# Patient Record
Sex: Female | Born: 1955 | Race: White | Hispanic: No | Marital: Married | Smoking: Former smoker
Health system: Southern US, Community
[De-identification: ages and names within clinical notes are randomized; demographics above are authoritative.]

## PROBLEM LIST (undated history)

## (undated) DIAGNOSIS — I10 Essential (primary) hypertension: Secondary | ICD-10-CM

## (undated) DIAGNOSIS — B029 Zoster without complications: Secondary | ICD-10-CM

## (undated) HISTORY — PX: APPENDECTOMY: SHX54

## (undated) HISTORY — DX: Essential (primary) hypertension: I10

## (undated) HISTORY — PX: ENDOMETRIAL ABLATION: SHX621

## (undated) HISTORY — PX: TUBAL LIGATION: SHX77

## (undated) HISTORY — PX: LEG SURGERY: SHX1003

## (undated) HISTORY — DX: Zoster without complications: B02.9

---

## 2015-07-29 ENCOUNTER — Encounter: Payer: Self-pay | Admitting: Gastroenterology

## 2015-08-17 ENCOUNTER — Ambulatory Visit: Payer: Self-pay | Admitting: Gastroenterology

## 2015-08-25 ENCOUNTER — Ambulatory Visit: Payer: Self-pay | Admitting: Gastroenterology

## 2017-01-22 ENCOUNTER — Other Ambulatory Visit (HOSPITAL_COMMUNITY): Payer: Self-pay | Admitting: Adult Health Nurse Practitioner

## 2017-01-22 DIAGNOSIS — Z87891 Personal history of nicotine dependence: Secondary | ICD-10-CM

## 2017-02-01 ENCOUNTER — Ambulatory Visit (HOSPITAL_COMMUNITY)
Admission: RE | Admit: 2017-02-01 | Discharge: 2017-02-01 | Disposition: A | Discharge status: Home / Self Care | Payer: BLUE CROSS/BLUE SHIELD | Source: Ambulatory Visit | Attending: Adult Health Nurse Practitioner | Admitting: Adult Health Nurse Practitioner

## 2017-02-01 DIAGNOSIS — J432 Centrilobular emphysema: Secondary | ICD-10-CM | POA: Insufficient documentation

## 2017-02-01 DIAGNOSIS — Z87891 Personal history of nicotine dependence: Secondary | ICD-10-CM | POA: Diagnosis not present

## 2017-02-01 DIAGNOSIS — Z122 Encounter for screening for malignant neoplasm of respiratory organs: Secondary | ICD-10-CM | POA: Diagnosis not present

## 2017-02-01 DIAGNOSIS — I2510 Atherosclerotic heart disease of native coronary artery without angina pectoris: Secondary | ICD-10-CM | POA: Insufficient documentation

## 2020-04-06 ENCOUNTER — Other Ambulatory Visit (HOSPITAL_COMMUNITY): Payer: Self-pay | Admitting: Physician Assistant

## 2020-04-06 DIAGNOSIS — Z1231 Encounter for screening mammogram for malignant neoplasm of breast: Secondary | ICD-10-CM

## 2020-04-15 ENCOUNTER — Ambulatory Visit (HOSPITAL_COMMUNITY)
Admission: RE | Admit: 2020-04-15 | Discharge: 2020-04-15 | Disposition: A | Discharge status: Home / Self Care | Payer: 59 | Source: Ambulatory Visit | Attending: Physician Assistant | Admitting: Physician Assistant

## 2020-04-15 ENCOUNTER — Other Ambulatory Visit: Payer: Self-pay

## 2020-04-15 DIAGNOSIS — Z1231 Encounter for screening mammogram for malignant neoplasm of breast: Secondary | ICD-10-CM | POA: Diagnosis present

## 2021-03-17 ENCOUNTER — Other Ambulatory Visit (HOSPITAL_COMMUNITY): Payer: Self-pay | Admitting: Physician Assistant

## 2021-03-17 DIAGNOSIS — Z1231 Encounter for screening mammogram for malignant neoplasm of breast: Secondary | ICD-10-CM

## 2021-04-19 ENCOUNTER — Ambulatory Visit (HOSPITAL_COMMUNITY)
Admission: RE | Admit: 2021-04-19 | Discharge: 2021-04-19 | Disposition: A | Discharge status: Home / Self Care | Payer: 59 | Source: Ambulatory Visit | Attending: Physician Assistant | Admitting: Physician Assistant

## 2021-04-19 ENCOUNTER — Other Ambulatory Visit: Payer: Self-pay

## 2021-04-19 ENCOUNTER — Other Ambulatory Visit (HOSPITAL_COMMUNITY): Payer: Self-pay | Admitting: *Deleted

## 2021-04-19 DIAGNOSIS — Z1231 Encounter for screening mammogram for malignant neoplasm of breast: Secondary | ICD-10-CM

## 2021-04-21 ENCOUNTER — Other Ambulatory Visit (HOSPITAL_COMMUNITY): Payer: Self-pay | Admitting: Nurse Practitioner

## 2021-04-21 ENCOUNTER — Other Ambulatory Visit (HOSPITAL_COMMUNITY): Payer: Self-pay | Admitting: Physician Assistant

## 2021-04-21 DIAGNOSIS — R928 Other abnormal and inconclusive findings on diagnostic imaging of breast: Secondary | ICD-10-CM

## 2021-04-26 ENCOUNTER — Other Ambulatory Visit (HOSPITAL_COMMUNITY): Payer: Self-pay | Admitting: Physician Assistant

## 2021-04-27 ENCOUNTER — Other Ambulatory Visit (HOSPITAL_COMMUNITY): Payer: Self-pay | Admitting: Physician Assistant

## 2021-05-01 ENCOUNTER — Other Ambulatory Visit (HOSPITAL_COMMUNITY): Payer: Self-pay | Admitting: Physician Assistant

## 2021-05-01 DIAGNOSIS — R928 Other abnormal and inconclusive findings on diagnostic imaging of breast: Secondary | ICD-10-CM

## 2021-05-23 ENCOUNTER — Ambulatory Visit (HOSPITAL_COMMUNITY)
Admission: RE | Admit: 2021-05-23 | Discharge: 2021-05-23 | Disposition: A | Discharge status: Home / Self Care | Payer: Medicare HMO | Source: Ambulatory Visit | Attending: Physician Assistant | Admitting: Physician Assistant

## 2021-05-23 ENCOUNTER — Other Ambulatory Visit: Payer: Self-pay

## 2021-05-23 DIAGNOSIS — R928 Other abnormal and inconclusive findings on diagnostic imaging of breast: Secondary | ICD-10-CM | POA: Insufficient documentation

## 2021-09-26 ENCOUNTER — Other Ambulatory Visit (HOSPITAL_COMMUNITY): Payer: Self-pay | Admitting: Physician Assistant

## 2021-09-26 DIAGNOSIS — R928 Other abnormal and inconclusive findings on diagnostic imaging of breast: Secondary | ICD-10-CM

## 2021-09-26 DIAGNOSIS — N644 Mastodynia: Secondary | ICD-10-CM

## 2021-12-19 ENCOUNTER — Other Ambulatory Visit: Payer: Self-pay

## 2021-12-19 ENCOUNTER — Ambulatory Visit (HOSPITAL_COMMUNITY)
Admission: RE | Admit: 2021-12-19 | Discharge: 2021-12-19 | Disposition: A | Discharge status: Home / Self Care | Payer: Medicare HMO | Source: Ambulatory Visit | Attending: Physician Assistant | Admitting: Physician Assistant

## 2021-12-19 DIAGNOSIS — R928 Other abnormal and inconclusive findings on diagnostic imaging of breast: Secondary | ICD-10-CM | POA: Diagnosis not present

## 2022-01-06 IMAGING — MG MM DIGITAL DIAGNOSTIC UNILAT*L* W/ TOMO W/ CAD
8 series · 9 of 24 positions shown · non-contrast
Comparison: Previous exam(s).

CLINICAL DATA: Patient returns after screening study for evaluation
of possible LEFT breast asymmetry.

EXAM:
DIGITAL DIAGNOSTIC UNILATERAL LEFT MAMMOGRAM WITH TOMOSYNTHESIS AND
CAD
TECHNIQUE: Left digital diagnostic mammography and breast tomosynthesis was
performed. The images were evaluated with computer-aided detection.

[L MLO synth-2D (1 of 3)]
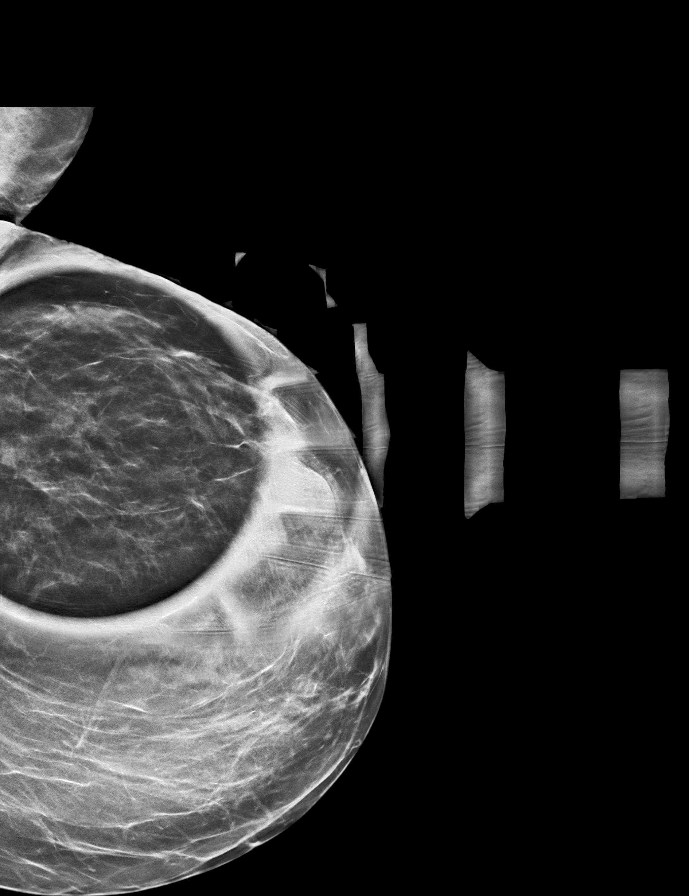

[L MLO synth-2D (2 of 3)]
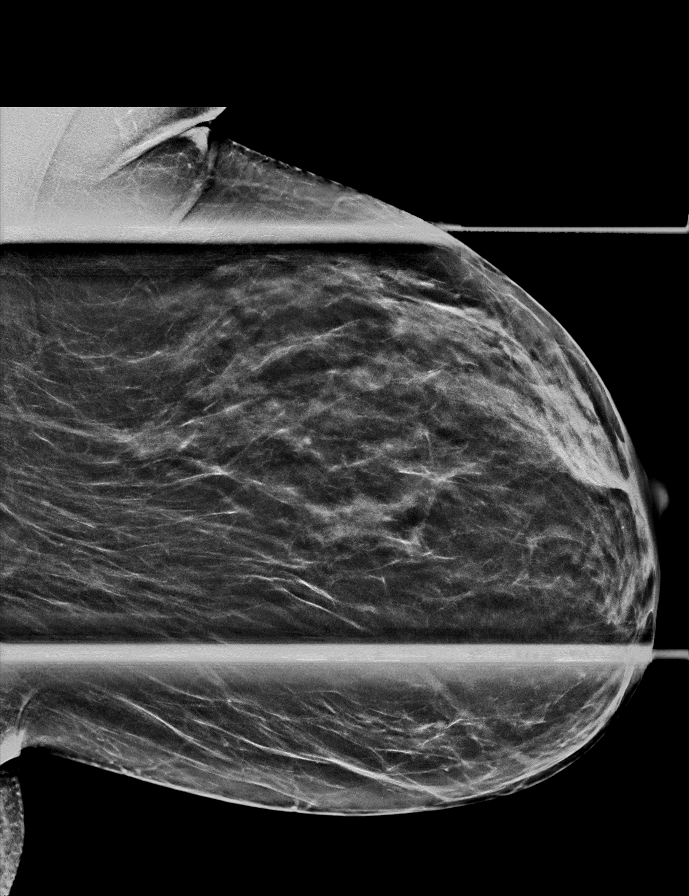

[L MLO synth-2D (3 of 3)]
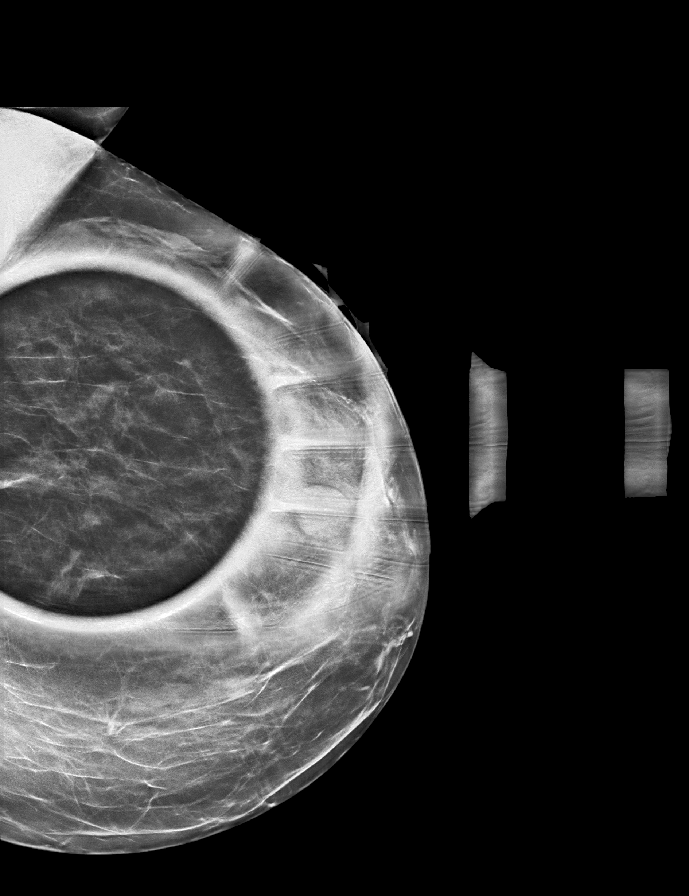

[L ML synth-2D]
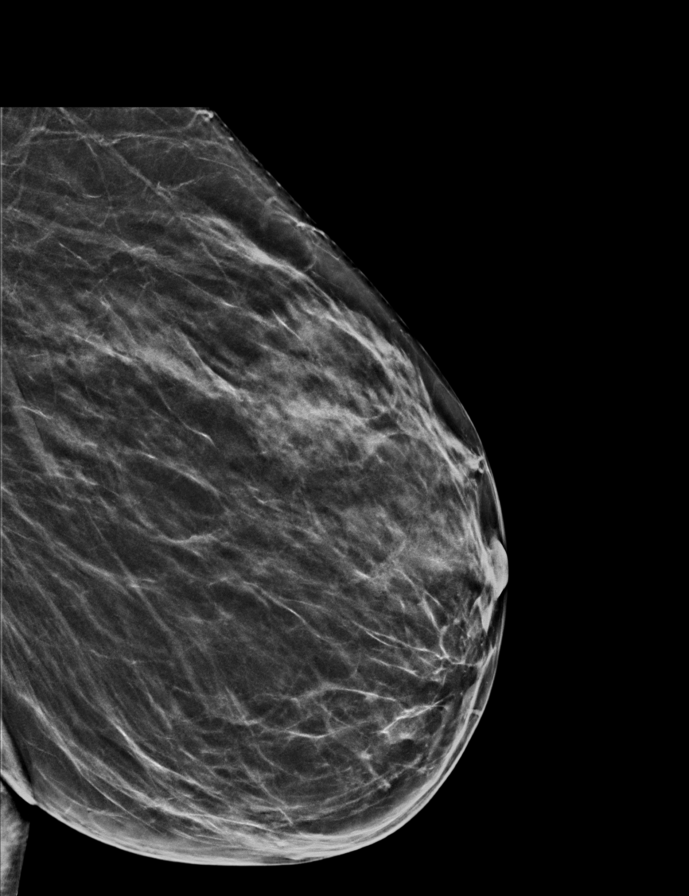

[L MLO tomo · 2 of 48 frames shown (1 of 3)]
[frame 16/48]
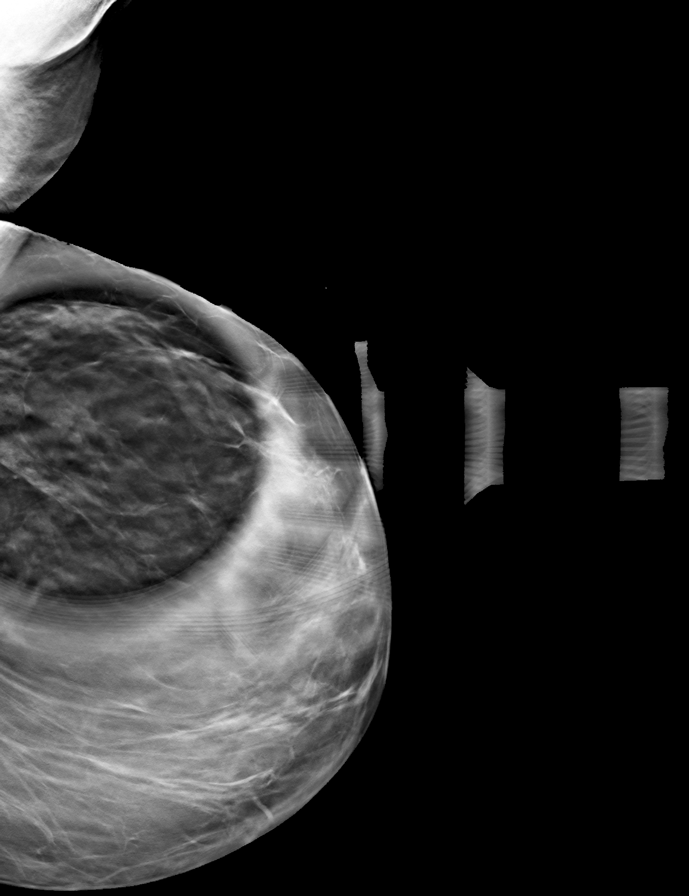
[frame 25/48]
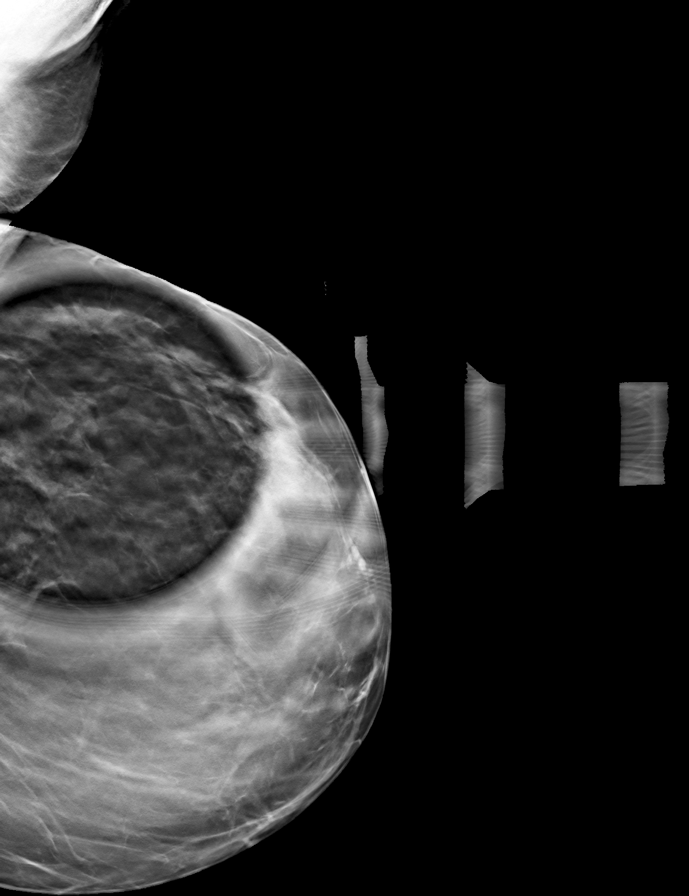

[L ML tomo · tomo slice 27/54.0]
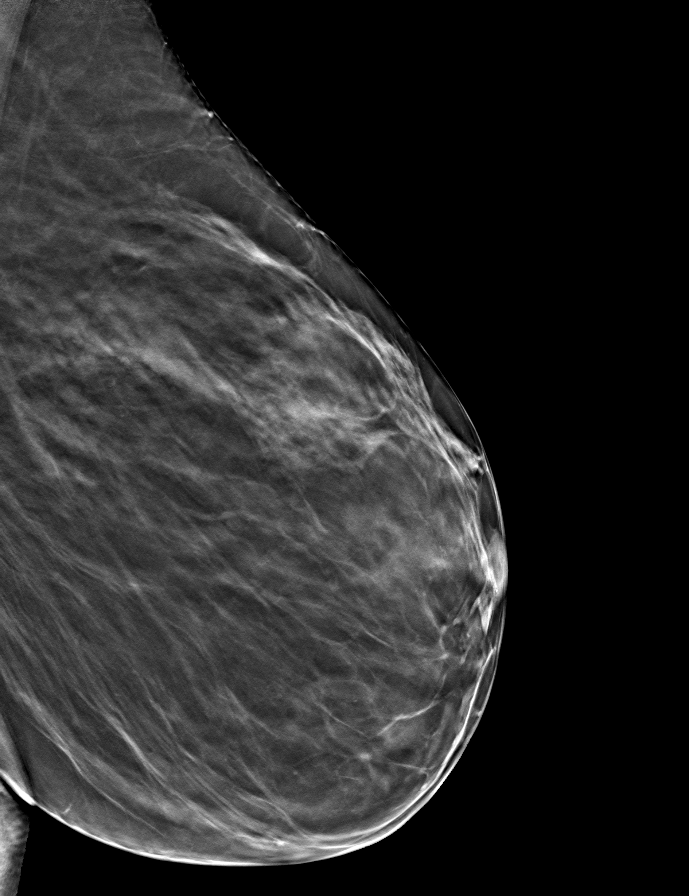

[L MLO tomo (2 of 3) · tomo slice 25/48.0]
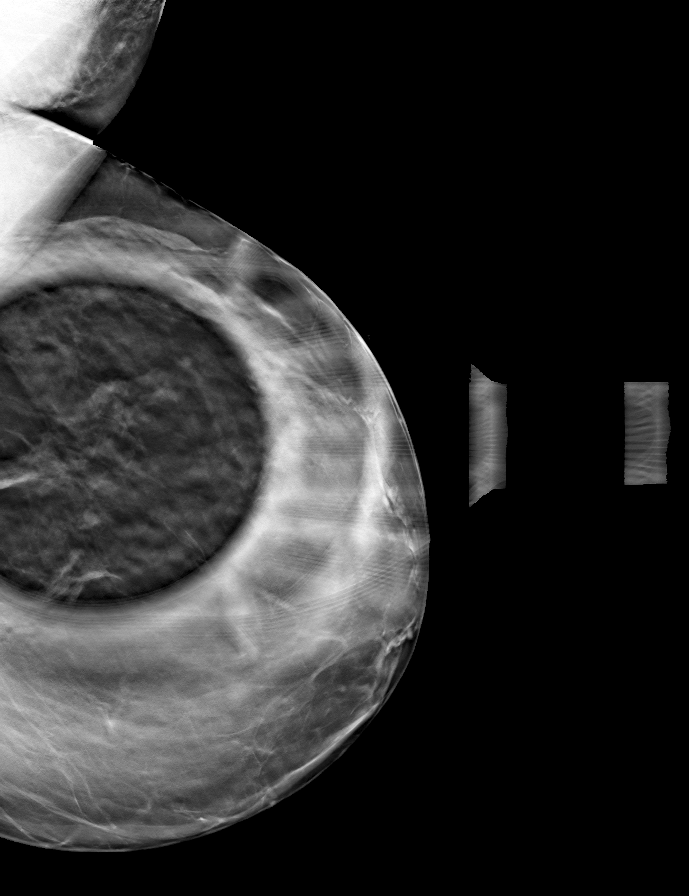

[L MLO tomo (3 of 3) · tomo slice 28/55.0]
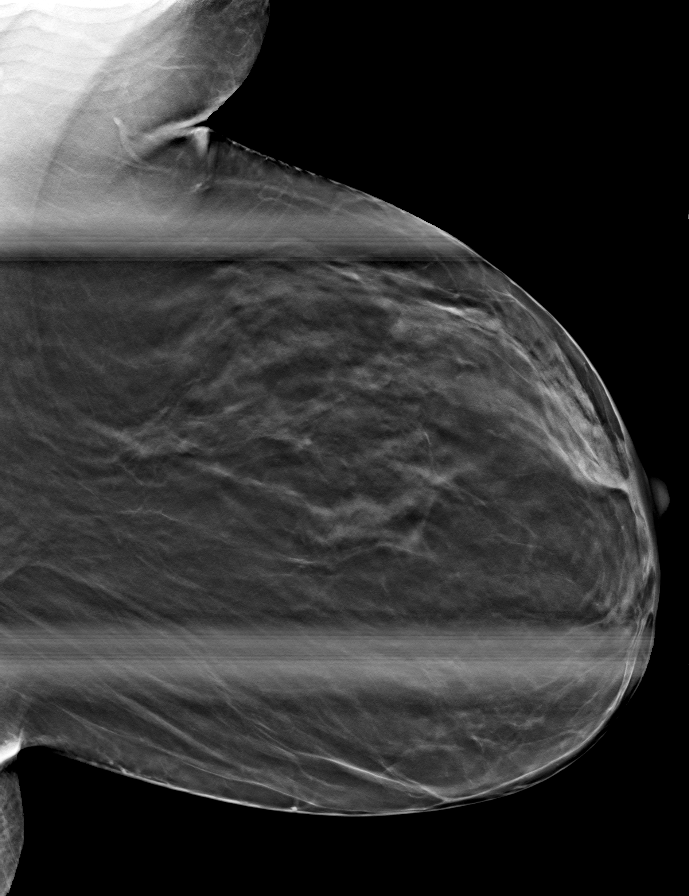

[9 of 24 positions shown; findings below may reference images not displayed]

ACR Breast Density Category b: There are scattered areas of
fibroglandular density.
FINDINGS: Additional 2-D and 3-D images are performed. These views show no
persistent asymmetry or distortion in the LEFT breast.
IMPRESSION: No mammographic evidence for malignancy.

RECOMMENDATION:
Screening mammogram in one year.(Code:6Z-[id number])

I have discussed the findings and recommendations with the patient.
If applicable, a reminder letter will be sent to the patient
regarding the next appointment.

BI-RADS CATEGORY  1: Negative.

## 2022-02-07 ENCOUNTER — Other Ambulatory Visit (HOSPITAL_COMMUNITY)
Admission: RE | Admit: 2022-02-07 | Discharge: 2022-02-07 | Disposition: A | Discharge status: Home / Self Care | Payer: Medicare HMO | Source: Ambulatory Visit | Attending: Obstetrics & Gynecology | Admitting: Obstetrics & Gynecology

## 2022-02-07 ENCOUNTER — Ambulatory Visit (INDEPENDENT_AMBULATORY_CARE_PROVIDER_SITE_OTHER): Payer: Medicare HMO | Admitting: Obstetrics & Gynecology

## 2022-02-07 ENCOUNTER — Other Ambulatory Visit (HOSPITAL_COMMUNITY): Payer: Self-pay | Admitting: Physician Assistant

## 2022-02-07 ENCOUNTER — Encounter: Payer: Self-pay | Admitting: Obstetrics & Gynecology

## 2022-02-07 VITALS — BP 147/74 | HR 74 | Ht 59.0 in | Wt 134.2 lb

## 2022-02-07 DIAGNOSIS — Z13820 Encounter for screening for osteoporosis: Secondary | ICD-10-CM

## 2022-02-07 DIAGNOSIS — Z1151 Encounter for screening for human papillomavirus (HPV): Secondary | ICD-10-CM | POA: Diagnosis not present

## 2022-02-07 DIAGNOSIS — Z01419 Encounter for gynecological examination (general) (routine) without abnormal findings: Secondary | ICD-10-CM | POA: Insufficient documentation

## 2022-02-07 NOTE — Progress Notes (Signed)
? ?  WELL-WOMAN EXAMINATION ?Patient name: Gabrielle Dixon MRN [id number]  Date of birth: [birth year] ?Chief Complaint:   ?Gynecologic Exam ? ?History of Present Illness:   ?Gabrielle Dixon is a 66 y.o. PM female being seen today for a routine well-woman exam.  ?Today she notes no acute complaints or concerns ? ?Denies vaginal bleeding, discharge, itching or irritation.  Not sexually active.  Denies pelvic or abdominal pain ? ? ?No LMP recorded. Patient has had an ablation. ? ? ?Last pap >15yrs ago.  ?Last mammogram: scheduled. ?Last colonoscopy: followed by PCP ? ? ?  02/07/2022  ?  8:49 AM  ?Depression screen PHQ 2/9  ?Decreased Interest 0  ?Down, Depressed, Hopeless 0  ?PHQ - 2 Score 0  ?Altered sleeping 0  ?Tired, decreased energy 1  ?Change in appetite 0  ?Feeling bad or failure about yourself  0  ?Trouble concentrating 0  ?Moving slowly or fidgety/restless 0  ?Suicidal thoughts 0  ?PHQ-9 Score 1  ? ? ? ? ?Review of Systems:   ?Pertinent items are noted in HPI ?Denies any headaches, blurred vision, fatigue, shortness of breath, chest pain, abdominal pain, bowel movements, urination, or intercourse unless otherwise stated above. ? ?Pertinent History Reviewed:  ?Reviewed past medical,surgical, social and family history.  ?Reviewed problem list, medications and allergies. ?Physical Assessment:  ? ?Vitals:  ? 02/07/22 0836  ?BP: (!) 147/74  ?Pulse: 74  ?Weight: 134 lb 3.2 oz (60.9 kg)  ?Height: 4\' 11"  (1.499 m)  ?Body mass index is 27.11 kg/m?. ?  ?     Physical Examination:  ? General appearance - well appearing, and in no distress ? Mental status - alert, oriented to person, place, and time ? Psych:  She has a normal mood and affect ? Skin - warm and dry, normal color, no suspicious lesions noted ? Chest - effort normal, all lung fields clear to auscultation bilaterally ? Heart - normal rate and regular rhythm ? Neck:  midline trachea, no thyromegaly or nodules ? Breasts - breasts appear normal, no suspicious masses, no skin  or nipple changes or  axillary nodes ? Abdomen - soft, nontender, nondistended, no masses or organomegaly ? Pelvic - VULVA: normal appearing vulva with no masses, tenderness or lesions  VAGINA: normal appearing vagina with normal color and discharge, no lesions  CERVIX: normal appearing cervix without discharge or lesions, no CMT ? Thin prep pap is done with HR HPV cotesting ? UTERUS: uterus is felt to be normal size, shape, consistency and nontender  ? ADNEXA: No adnexal masses or tenderness noted. ? Extremities:  No swelling or varicosities noted ? ?Chaperone:   ? ? ?Assessment & Plan:  ?1) Well-Woman Exam ?-pap collected, reviewed screening guidelines ?-since no prior records encouraged additional co-test in 3 yrs, if negative then no further testing ? ? ?Meds: No orders of the defined types were placed in this encounter. ? ? ?Follow-up: Return in about 3 years (around 02/07/2025) for Annual. ? ? ?02/09/2025, DO ?Attending Obstetrician & Gynecologist, Faculty Practice ?[facility], [facility] ? ? ?

## 2022-02-09 LAB — CYTOLOGY - PAP
Comment: NEGATIVE
Diagnosis: NEGATIVE
High risk HPV: NEGATIVE

## 2022-02-13 ENCOUNTER — Ambulatory Visit (HOSPITAL_COMMUNITY)
Admission: RE | Admit: 2022-02-13 | Discharge: 2022-02-13 | Disposition: A | Discharge status: Home / Self Care | Payer: Medicare HMO | Source: Ambulatory Visit | Attending: Physician Assistant | Admitting: Physician Assistant

## 2022-02-13 ENCOUNTER — Telehealth: Payer: Self-pay

## 2022-02-13 DIAGNOSIS — Z780 Asymptomatic menopausal state: Secondary | ICD-10-CM | POA: Insufficient documentation

## 2022-02-13 DIAGNOSIS — Z13820 Encounter for screening for osteoporosis: Secondary | ICD-10-CM | POA: Diagnosis present

## 2022-02-13 DIAGNOSIS — M810 Age-related osteoporosis without current pathological fracture: Secondary | ICD-10-CM | POA: Insufficient documentation

## 2022-02-13 NOTE — Telephone Encounter (Signed)
Called pt per Dr Charlotta Newton to relay pap smear results, no answer, left vm stating her test was negative. ?

## 2022-02-13 NOTE — Telephone Encounter (Signed)
-----   Message from Myna Hidalgo, DO sent at 02/12/2022  2:01 PM EDT ----- ?Pap/HPV negative ?

## 2022-04-25 ENCOUNTER — Other Ambulatory Visit (HOSPITAL_COMMUNITY): Payer: Self-pay | Admitting: Physician Assistant

## 2022-04-25 DIAGNOSIS — Z1231 Encounter for screening mammogram for malignant neoplasm of breast: Secondary | ICD-10-CM

## 2022-06-01 ENCOUNTER — Ambulatory Visit (HOSPITAL_COMMUNITY): Payer: Medicare HMO

## 2022-06-07 ENCOUNTER — Ambulatory Visit (HOSPITAL_COMMUNITY)
Admission: RE | Admit: 2022-06-07 | Discharge: 2022-06-07 | Disposition: A | Discharge status: Home / Self Care | Payer: Medicare HMO | Source: Ambulatory Visit | Attending: Physician Assistant | Admitting: Physician Assistant

## 2022-06-07 DIAGNOSIS — Z1231 Encounter for screening mammogram for malignant neoplasm of breast: Secondary | ICD-10-CM | POA: Insufficient documentation

## 2022-08-04 IMAGING — US US BREAST*L* LIMITED INC AXILLA
1 series · 4 of 4 positions shown · non-contrast
Comparison: Previous exams.

CLINICAL DATA: 65-year-old female with focal tenderness involving
the upper inner left breast.

EXAM:
ULTRASOUND OF THE LEFT BREAST

[Series 1: us breast*left* limited inc axilla · 0.09mm/px · 4 of 4 slices shown]
[im 1/4]
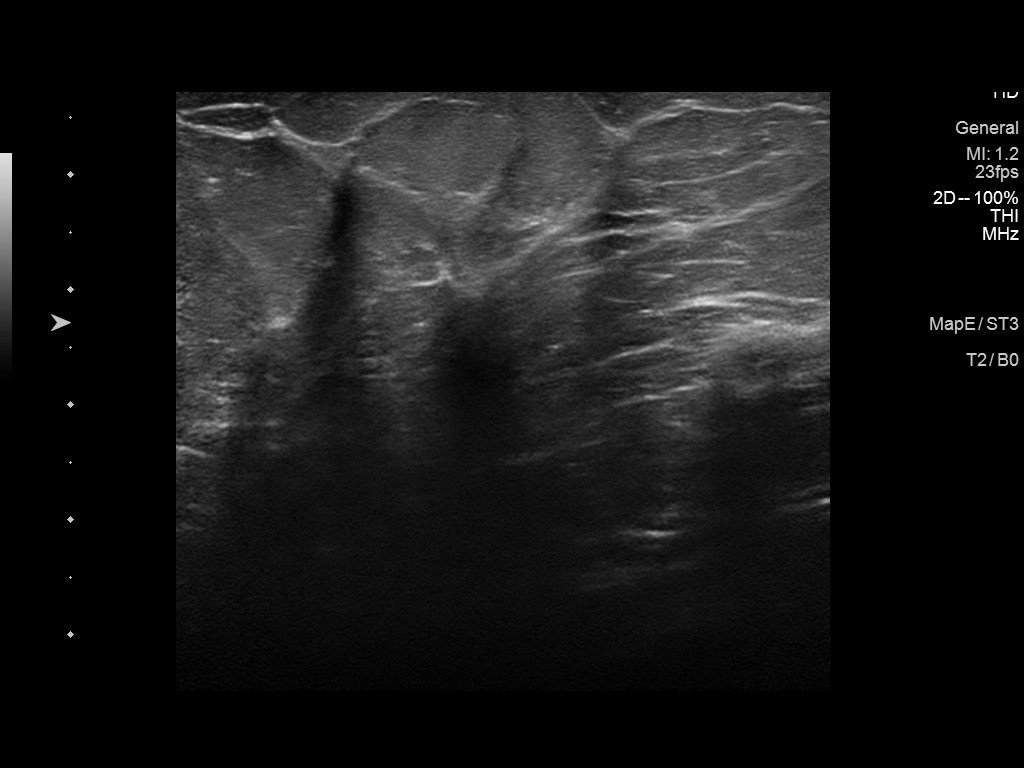
[im 2/4]
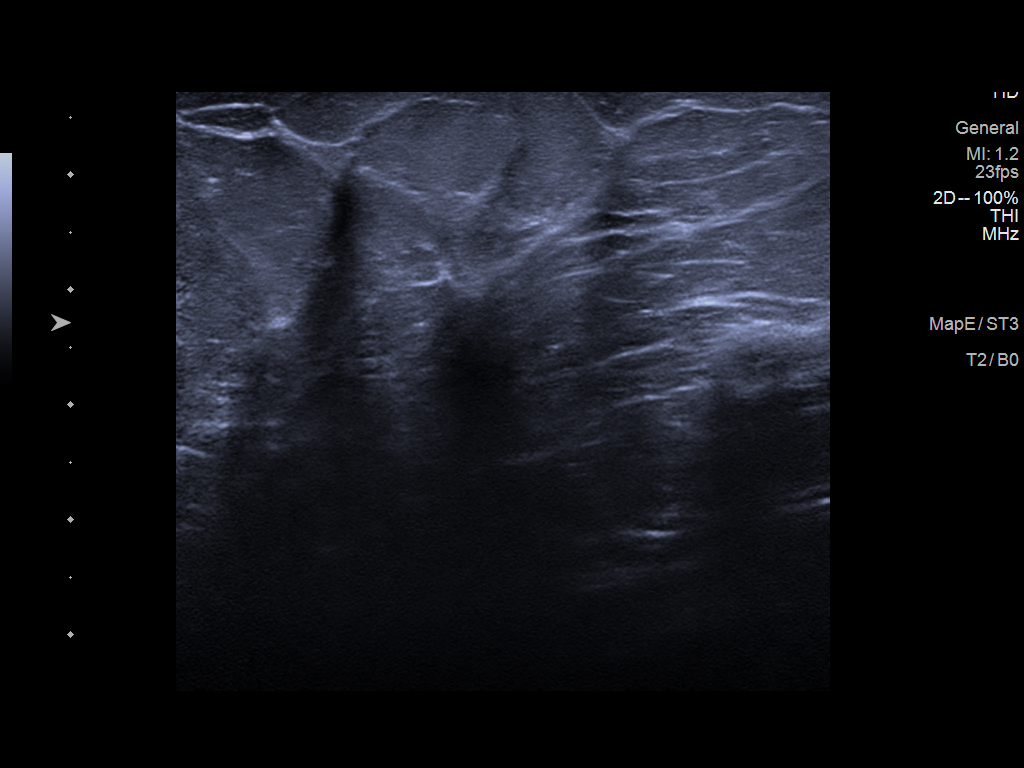
[im 3/4]
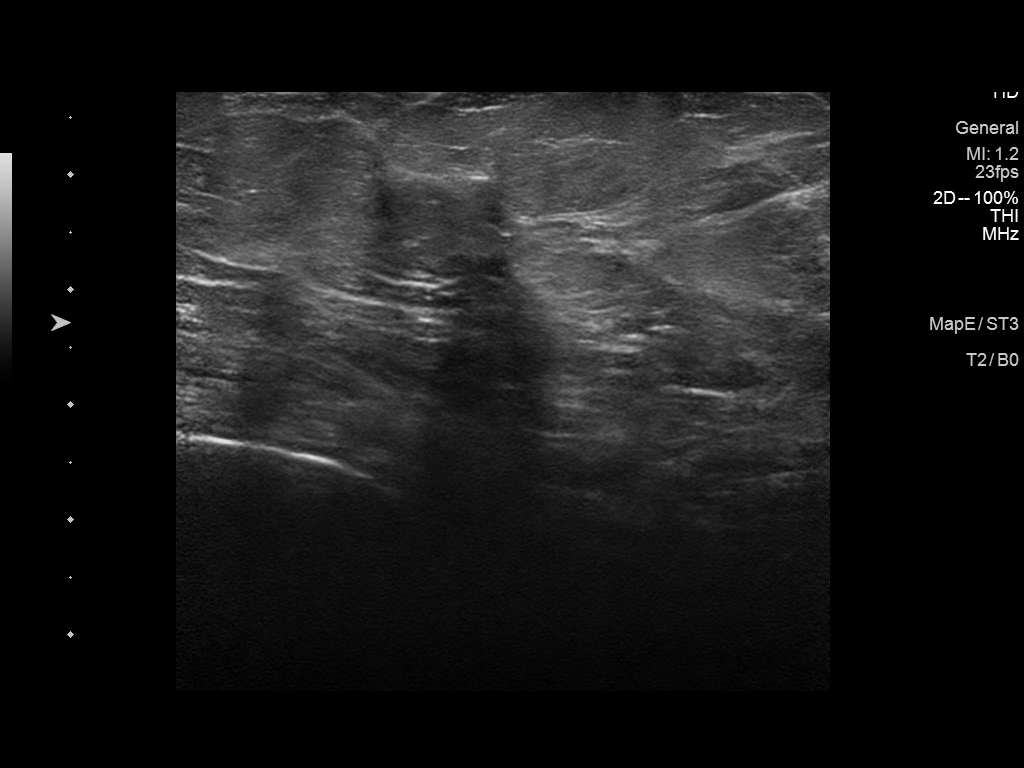
[im 4/4]
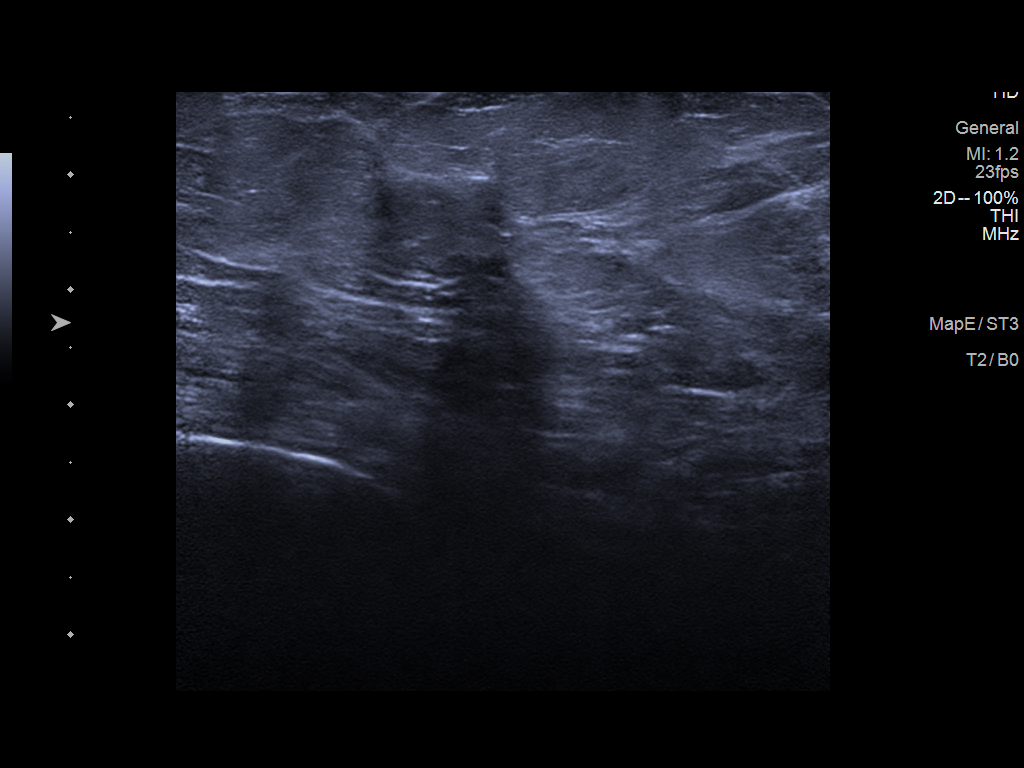

[4 of 4 positions shown; findings below may reference images not displayed]

FINDINGS: Physical examination at the site of concern in the lower inner left
breast does not reveal any discrete palpable masses.

Targeted ultrasound of the left breast was performed. No suspicious
masses or abnormality seen, only normal-appearing fibroglandular
tissue identified.
IMPRESSION: No sonographic abnormalities at the site of tenderness in the left
breast.

RECOMMENDATION:
1. Recommend further management of left breast symptoms be based on
clinical assessment.

2.  Recommend annual screening mammography, due March 2022.

I have discussed the findings and recommendations with the patient.
If applicable, a reminder letter will be sent to the patient
regarding the next appointment.

BI-RADS CATEGORY  1: Negative.

## 2023-05-16 ENCOUNTER — Other Ambulatory Visit (HOSPITAL_COMMUNITY): Payer: Self-pay | Admitting: Physician Assistant

## 2023-05-16 DIAGNOSIS — Z1231 Encounter for screening mammogram for malignant neoplasm of breast: Secondary | ICD-10-CM

## 2023-06-10 ENCOUNTER — Ambulatory Visit (HOSPITAL_COMMUNITY)
Admission: RE | Admit: 2023-06-10 | Discharge: 2023-06-10 | Disposition: A | Discharge status: Home / Self Care | Payer: Medicare HMO | Source: Ambulatory Visit | Attending: Physician Assistant | Admitting: Physician Assistant

## 2023-06-10 DIAGNOSIS — Z1231 Encounter for screening mammogram for malignant neoplasm of breast: Secondary | ICD-10-CM | POA: Diagnosis present

## 2024-03-09 ENCOUNTER — Ambulatory Visit: Admitting: Internal Medicine

## 2024-03-13 ENCOUNTER — Ambulatory Visit: Admitting: Internal Medicine

## 2024-05-11 ENCOUNTER — Other Ambulatory Visit (HOSPITAL_COMMUNITY): Payer: Self-pay | Admitting: Physician Assistant

## 2024-05-11 DIAGNOSIS — Z1231 Encounter for screening mammogram for malignant neoplasm of breast: Secondary | ICD-10-CM

## 2024-05-29 ENCOUNTER — Encounter: Payer: Self-pay | Admitting: Internal Medicine

## 2024-05-29 ENCOUNTER — Ambulatory Visit: Attending: Internal Medicine | Admitting: Internal Medicine

## 2024-05-29 VITALS — BP 144/70 | HR 62 | Ht 59.0 in | Wt 134.6 lb

## 2024-05-29 DIAGNOSIS — R079 Chest pain, unspecified: Secondary | ICD-10-CM | POA: Insufficient documentation

## 2024-05-29 DIAGNOSIS — I10 Essential (primary) hypertension: Secondary | ICD-10-CM | POA: Diagnosis not present
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
## 2024-05-29 NOTE — Patient Instructions (Signed)
Medication Instructions:  Continue all current medications.  Labwork: none  Testing/Procedures: none  Follow-Up: As needed.    Any Other Special Instructions Will Be Listed Below (If Applicable).  If you need a refill on your cardiac medications before your next appointment, please call your pharmacy.  

## 2024-06-10 ENCOUNTER — Encounter (HOSPITAL_COMMUNITY): Payer: Self-pay

## 2024-06-10 ENCOUNTER — Ambulatory Visit (HOSPITAL_COMMUNITY)
Admission: RE | Admit: 2024-06-10 | Discharge: 2024-06-10 | Disposition: A | Discharge status: Home / Self Care | Source: Ambulatory Visit | Attending: Physician Assistant | Admitting: Physician Assistant

## 2024-06-10 DIAGNOSIS — Z1231 Encounter for screening mammogram for malignant neoplasm of breast: Secondary | ICD-10-CM | POA: Insufficient documentation
